# Patient Record
Sex: Female | Born: 2000 | Race: White | Hispanic: No | Marital: Single | Smoking: Never smoker
Health system: Southern US, Community
[De-identification: ages and names within clinical notes are randomized; demographics above are authoritative.]

## PROBLEM LIST (undated history)

## (undated) DIAGNOSIS — N390 Urinary tract infection, site not specified: Secondary | ICD-10-CM

## (undated) HISTORY — DX: Urinary tract infection, site not specified: N39.0

## (undated) HISTORY — PX: NO PAST SURGERIES: SHX2092

---
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
## 2020-04-05 NOTE — Patient Instructions (Signed)
I value your feedback and you entrusting us with your care. If you get a [facility] patient survey, I would appreciate you taking the time to let us know about your experience today. Thank you! ? ? ?

## 2020-04-05 NOTE — Progress Notes (Signed)
Patient, No Pcp Per   Chief Complaint  Patient presents with  . Establish Care    Possible skin tags??? Noticed X6 weeks ago noticed one bump, X1 week ago noticed multiple bumps, pt states it may have went away, no pain, no itching      HPI:      Ms. Linda Gardner is a 20 y.o. No obstetric history on file. whose LMP was No LMP recorded., presents today for NP eval of vaginal lesions. Noticed 1 bump on labia about 6 wks ago that was flesh colored. Noticed a few more a few days ago. No sx this AM. No pain, itching. Does shave. No vag d/c, odor.  Has had urinary frequency and nocturia recently, usually with good flow. No dysuria. Hx of UTIs in past and doesn't feel the same. Drinks lots of water and caffeine.  She is sex active, no new partners. Had [facility], amenorrheic with occas spotting. Last STD testing about a yr ago.    Past Medical History:  Diagnosis Date  . UTI (urinary tract infection)     History reviewed. No pertinent surgical history.  Family History  Problem Relation Age of Onset  . Stroke Maternal Grandmother     Social History   Socioeconomic History  . Marital status: Single    Spouse name: Not on file  . Number of children: Not on file  . Years of education: Not on file  . Highest education level: Not on file  Occupational History  . Not on file  Tobacco Use  . Smoking status: Never Smoker  . Smokeless tobacco: Never Used  Vaping Use  . Vaping Use: Never used  Substance and Sexual Activity  . Alcohol use: Never  . Drug use: Never  . Sexual activity: Yes  Other Topics Concern  . Not on file  Social History Narrative  . Not on file   Social Determinants of Health   Financial Resource Strain: Not on file  Food Insecurity: Not on file  Transportation Needs: Not on file  Physical Activity: Not on file  Stress: Not on file  Social Connections: Not on file  Intimate Partner Violence: Not on file    Outpatient Medications Prior to Visit   Medication Sig Dispense Refill  . levonorgestrel (KYLEENA) 19.5 MG IUD by Intrauterine route once.     No facility-administered medications prior to visit.      ROS:  Review of Systems  Constitutional: Negative for fever, malaise/fatigue and weight loss.  Gastrointestinal: Negative for blood in stool, constipation, diarrhea, nausea and vomiting.  Genitourinary: Positive for frequency and genital sores. Negative for dyspareunia, dysuria, flank pain, hematuria, urgency, vaginal bleeding, vaginal discharge and vaginal pain.  Musculoskeletal: Negative for back pain.  Skin: Negative for itching and rash.   BREAST: No symptoms   OBJECTIVE:   Vitals:  BP 108/74   Pulse 72   Temp 98.6 F (37 C) (Oral)   Ht 5\' 7"  (1.702 m)   Wt 144 lb (65.3 kg)   SpO2 99%   BMI 22.55 kg/m   Physical Exam Vitals reviewed.  Constitutional:      Appearance: She is well-developed.  Pulmonary:     Effort: Pulmonary effort is normal.  Genitourinary:    General: Normal vulva.     Pubic Area: No rash.      Labia:        Right: No rash, tenderness or lesion.        Left: No rash,  tenderness or lesion.      Vagina: Bleeding present. No vaginal discharge, erythema or tenderness.     Cervix: Normal.     Uterus: Normal. Not enlarged and not tender.      Adnexa: Right adnexa normal and left adnexa normal.       Right: No mass or tenderness.         Left: No mass or tenderness.       Comments: NEG EXT VAG EXAM; NO LESIONS; IUD STRINGS IN CX OS Musculoskeletal:        General: Normal range of motion.     Cervical back: Normal range of motion.  Skin:    General: Skin is warm and dry.  Neurological:     General: No focal deficit present.     Mental Status: She is alert and oriented to person, place, and time.  Psychiatric:        Mood and Affect: Mood normal.        Behavior: Behavior normal.        Thought Content: Thought content normal.        Judgment: Judgment normal.      Results: Results for orders placed or performed in visit on 04/06/20 (from the past 24 hour(s))  POCT Urinalysis Dipstick     Status: Normal   Collection Time: 04/06/20 11:13 AM  Result Value Ref Range   Color, UA yellow    Clarity, UA clear    Glucose, UA Negative Negative   Bilirubin, UA neg    Ketones, UA neg    Spec Grav, UA 1.020 1.010 - 1.025   Blood, UA trace    pH, UA 6.0 5.0 - 8.0   Protein, UA Negative Negative   Urobilinogen, UA     Nitrite, UA neg    Leukocytes, UA Negative Negative   Appearance     Odor     PT WITH MENSTRUAL SPOTTING  Assessment/Plan: Normal vaginal exam--no ext lesions. Question blocked glands vs papules. Reassurance. F/u prn  Urinary frequency - Plan: POCT Urinalysis Dipstick; neg UA. Most likely due to caffeine. D/C and f/u prn.  Screening for STD (sexually transmitted disease) - Plan: Cervicovaginal ancillary only  Encounter for routine checking of intrauterine contraceptive device (IUD)--IUD strings in cx os    Return if symptoms worsen or fail to improve.  Orry Sigl B. Mariaha Ellington, PA-C 04/06/2020 11:15 AM

## 2020-04-06 ENCOUNTER — Other Ambulatory Visit (HOSPITAL_COMMUNITY)
Admission: RE | Admit: 2020-04-06 | Discharge: 2020-04-06 | Disposition: A | Discharge status: Home / Self Care | Payer: 59 | Source: Ambulatory Visit | Attending: Obstetrics and Gynecology | Admitting: Obstetrics and Gynecology

## 2020-04-06 ENCOUNTER — Encounter: Payer: Self-pay | Admitting: Obstetrics and Gynecology

## 2020-04-06 ENCOUNTER — Other Ambulatory Visit: Payer: Self-pay

## 2020-04-06 ENCOUNTER — Ambulatory Visit (INDEPENDENT_AMBULATORY_CARE_PROVIDER_SITE_OTHER): Payer: 59 | Admitting: Obstetrics and Gynecology

## 2020-04-06 VITALS — BP 108/74 | HR 72 | Temp 98.6°F | Ht 67.0 in | Wt 144.0 lb

## 2020-04-06 DIAGNOSIS — Z30431 Encounter for routine checking of intrauterine contraceptive device: Secondary | ICD-10-CM

## 2020-04-06 DIAGNOSIS — Z01419 Encounter for gynecological examination (general) (routine) without abnormal findings: Secondary | ICD-10-CM | POA: Diagnosis not present

## 2020-04-06 DIAGNOSIS — R350 Frequency of micturition: Secondary | ICD-10-CM | POA: Diagnosis not present

## 2020-04-06 DIAGNOSIS — Z113 Encounter for screening for infections with a predominantly sexual mode of transmission: Secondary | ICD-10-CM | POA: Insufficient documentation

## 2020-04-06 LAB — POCT URINALYSIS DIPSTICK
Bilirubin, UA: NEGATIVE
Glucose, UA: NEGATIVE
Ketones, UA: NEGATIVE
Leukocytes, UA: NEGATIVE
Nitrite, UA: NEGATIVE
Protein, UA: NEGATIVE
Spec Grav, UA: 1.02 (ref 1.010–1.025)
pH, UA: 6 (ref 5.0–8.0)

## 2020-04-07 LAB — CERVICOVAGINAL ANCILLARY ONLY
Chlamydia: NEGATIVE
Comment: NEGATIVE
Comment: NORMAL
Neisseria Gonorrhea: NEGATIVE

## 2020-05-01 ENCOUNTER — Ambulatory Visit (INDEPENDENT_AMBULATORY_CARE_PROVIDER_SITE_OTHER): Payer: 59 | Admitting: Obstetrics and Gynecology

## 2020-05-01 ENCOUNTER — Encounter: Payer: Self-pay | Admitting: Obstetrics and Gynecology

## 2020-05-01 ENCOUNTER — Other Ambulatory Visit: Payer: Self-pay

## 2020-05-01 VITALS — BP 118/74 | Ht 67.0 in | Wt 141.0 lb

## 2020-05-01 DIAGNOSIS — R350 Frequency of micturition: Secondary | ICD-10-CM | POA: Diagnosis not present

## 2020-05-01 DIAGNOSIS — R300 Dysuria: Secondary | ICD-10-CM

## 2020-05-01 MED ORDER — SULFAMETHOXAZOLE-TRIMETHOPRIM 800-160 MG PO TABS: 1.0000 | ORAL_TABLET | Freq: Two times a day (BID) | ORAL | 0 refills | Status: AC

## 2020-05-01 NOTE — Progress Notes (Signed)
Obstetrics & Gynecology Office Visit    Chief Complaint  Patient presents with  . Urinary Tract Infection    History of Present Illness: Urinary Tract Infection: Patient complains of dysuria and frequency She has had symptoms for 3 days (basically started 1 week ago). Patient also complains of no other issues. Patient denies back pain, fever and vaginal discharge. Patient does have a history of recurrent UTI.  Patient does not have a history of pyelonephritis. She makes her third UTI in the past year. She is sexually active and had STI testing recently.   Past Medical History:  Diagnosis Date  . UTI (urinary tract infection)     Past Surgical History:  Procedure Laterality Date  . NO PAST SURGERIES      Gynecologic History: No LMP recorded.  Obstetric History: G0P0000  Family History  Problem Relation Age of Onset  . Stroke Maternal Grandmother     Social History   Socioeconomic History  . Marital status: Single    Spouse name: Not on file  . Number of children: Not on file  . Years of education: Not on file  . Highest education level: Not on file  Occupational History  . Not on file  Tobacco Use  . Smoking status: Never Smoker  . Smokeless tobacco: Never Used  Vaping Use  . Vaping Use: Never used  Substance and Sexual Activity  . Alcohol use: Never  . Drug use: Never  . Sexual activity: Yes  Other Topics Concern  . Not on file  Social History Narrative  . Not on file   Social Determinants of Health   Financial Resource Strain: Not on file  Food Insecurity: Not on file  Transportation Needs: Not on file  Physical Activity: Not on file  Stress: Not on file  Social Connections: Not on file  Intimate Partner Violence: Not on file    No Known Allergies  Prior to Admission medications   Medication Sig Start Date End Date Taking? Authorizing Provider  levonorgestrel (KYLEENA) 19.5 MG IUD by Intrauterine route once.    [provider]     Review of Systems  Constitutional: Negative.   HENT: Negative.   Eyes: Negative.   Respiratory: Negative.   Cardiovascular: Negative.   Gastrointestinal: Negative.   Genitourinary: Negative.   Musculoskeletal: Negative.   Skin: Negative.   Neurological: Negative.   Psychiatric/Behavioral: Negative.      Physical Exam BP 118/74   Ht 5\' 7"  (1.702 m)   Wt 141 lb (64 kg)   BMI 22.08 kg/m  No LMP recorded. Physical Exam Constitutional:      General: She is not in acute distress.    Appearance: Normal appearance.  HENT:     Head: Normocephalic and atraumatic.  Eyes:     General: No scleral icterus.    Conjunctiva/sclera: Conjunctivae normal.  Neurological:     General: No focal deficit present.     Mental Status: She is alert and oriented to person, place, and time.     Cranial Nerves: No cranial nerve deficit.  Psychiatric:        Mood and Affect: Mood normal.        Behavior: Behavior normal.        Judgment: Judgment normal.    UA: equivocal due to taking AZO  Female chaperone present for pelvic and breast  portions of the physical exam  Assessment: 20 y.o. G0P0000 female here for  1. Dysuria   2. Urinary frequency  Plan: Problem List Items Addressed This Visit   None   Visit Diagnoses    Dysuria    -  Primary   Relevant Medications   sulfamethoxazole-trimethoprim (BACTRIM DS) 800-160 MG tablet   Other Relevant Orders   Urine Culture   Urinary frequency       Relevant Medications   sulfamethoxazole-trimethoprim (BACTRIM DS) 800-160 MG tablet   Other Relevant Orders   Urine Culture     Will send urine for culture and sensitivities.  With 3 UTI this year, may be a candidate for rx as needed.   A total of 21 minutes were spent face-to-face with the patient as well as preparation, review, communication, and documentation during this encounter.    Thomasene Mohair, MD 05/01/2020 5:09 PM

## 2020-05-04 LAB — URINE CULTURE

## 2021-07-25 ENCOUNTER — Encounter: Payer: Self-pay | Admitting: Emergency Medicine

## 2021-07-25 ENCOUNTER — Other Ambulatory Visit: Payer: Self-pay

## 2021-07-25 ENCOUNTER — Emergency Department: Payer: 59

## 2021-07-25 ENCOUNTER — Emergency Department
Admission: EM | Admit: 2021-07-25 | Discharge: 2021-07-25 | Disposition: A | Discharge status: Home / Self Care | Payer: 59 | Attending: Emergency Medicine | Admitting: Emergency Medicine

## 2021-07-25 DIAGNOSIS — M542 Cervicalgia: Secondary | ICD-10-CM | POA: Insufficient documentation

## 2021-07-25 DIAGNOSIS — R519 Headache, unspecified: Secondary | ICD-10-CM | POA: Diagnosis not present

## 2021-07-25 DIAGNOSIS — Y92410 Unspecified street and highway as the place of occurrence of the external cause: Secondary | ICD-10-CM | POA: Diagnosis not present

## 2021-07-25 DIAGNOSIS — S60511A Abrasion of right hand, initial encounter: Secondary | ICD-10-CM | POA: Insufficient documentation

## 2021-07-25 DIAGNOSIS — S6991XA Unspecified injury of right wrist, hand and finger(s), initial encounter: Secondary | ICD-10-CM | POA: Diagnosis present

## 2021-07-25 MED ORDER — METHOCARBAMOL 500 MG PO TABS
500.0000 mg | ORAL_TABLET | Freq: Three times a day (TID) | ORAL | 0 refills | Status: AC | PRN
Start: 2021-07-25 — End: 2021-07-30

## 2021-07-25 MED ORDER — MELOXICAM 15 MG PO TABS: 15.0000 mg | ORAL_TABLET | Freq: Every day | ORAL | 2 refills | Status: DC
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
## 2021-07-25 NOTE — Discharge Instructions (Signed)
Take meloxicam and Robaxin as directed. 

## 2022-04-17 ENCOUNTER — Encounter: Payer: Self-pay | Admitting: Oncology

## 2022-04-17 ENCOUNTER — Ambulatory Visit (INDEPENDENT_AMBULATORY_CARE_PROVIDER_SITE_OTHER): Payer: Managed Care, Other (non HMO) | Admitting: Oncology

## 2022-04-17 VITALS — BP 112/70 | HR 56 | Temp 98.3°F | Resp 18 | Ht 67.0 in | Wt 129.0 lb

## 2022-04-17 DIAGNOSIS — K130 Diseases of lips: Secondary | ICD-10-CM

## 2022-04-17 NOTE — Progress Notes (Signed)
[address], [place] [address] Phone: [phone] Fax: [phone]   Office Visit Note  Patient Name: Linda Gardner  Date of JJOAC:[id number]  Med Rec number [id number]  Date of Service: 04/17/2022  Patient has no known allergies.  Chief Complaint  Patient presents with   Rash   Patient is an 22 y.o. student here for complaints of lip lesion. Sx started two weeks ago. Felt like her whole right side of her face was puffy one morning. Denies pain. While showering the lesion on her lip "popped" open and white pus/discharge came out.  Has history of acne but not cystic.  Denies injury.  Denies any new products or medications.  States she only had pain the day and it "popped".  Otherwise he is feels okay.  Has not tried anything over-the-counter.  Would like a referral to dermatology to have it looked at.  No fevers.   Current Medication:  Outpatient Encounter Medications as of 04/17/2022  Medication Sig   levonorgestrel (KYLEENA) 19.5 MG IUD by Intrauterine route once.   [DISCONTINUED] meloxicam (MOBIC) 15 MG tablet Take 1 tablet (15 mg total) by mouth daily.   No facility-administered encounter medications on file as of 04/17/2022.     Medical History: Past Medical History:  Diagnosis Date   UTI (urinary tract infection)    Vital Signs: BP 112/70   Pulse (!) 56   Temp 98.3 F (36.8 C) (Tympanic)   Resp 18   Ht 5\' 7"  (1.702 m)   Wt 129 lb (58.5 kg)   SpO2 99%   BMI 20.20 kg/m   ROS: As per HPI.  All other pertinent ROS negative.     Review of Systems  Skin:        Upper lip skin lesion    Physical Exam Constitutional:      Appearance: Normal appearance.  Skin:    Findings: Lesion and rash present. Rash is nodular.     Comments: Right upper lip nodular lesion about the size of a BB. No fluctuant head. No pain. No erythema.   Neurological:     Mental Status: She is alert.    No results found for this or any previous visit (from  the past 24 hour(s)).  Assessment/Plan: 1. Lip lesion -Referral to dermatology per student and family. -Discussed trying warm compresses to see if this promotes the nodule to come to the surface. -If symptoms become painful or worsen, would recommend an antibiotic such as doxycycline until she is evaluated by dermatology.  Information sent via MyChart for her to review.  - Ambulatory referral to Dermatology   Disposition-return to clinic for worsening symptoms.  Please let me know if he would like to try doxycycline should your symptoms worsen.  General Counseling: Linda Gardner understanding of the findings of todays visit and agrees with plan of treatment. I have discussed any further diagnostic evaluation that may be needed or ordered today. We also reviewed her medications today. she has been encouraged to call the office with any questions or concerns that should arise related to todays visit.   No orders of the defined types were placed in this encounter.   No orders of the defined types were placed in this encounter.   I spent 20 minutes dedicated to the care of this patient (face-to-face and non-face-to-face) on the date of the encounter to include what is described in the assessment and plan.   Linda Casa, NP 04/17/2022 9:40 AM

## 2022-04-22 ENCOUNTER — Telehealth: Payer: Self-pay | Admitting: Oncology

## 2022-04-22 NOTE — Telephone Encounter (Signed)
Mother called and stated that Linda Gardner would not be able to see her until march, so she called around and got Linda Gardner scheduled for Thursday 1/25 at [facility]. I sent referral notes to them Monday afternoon, got the confirmation fax and called their office to make sure they received. I called patient and mother back as well to let them know this. This is for your FYI. Not sure if you need to change anything in EPIC for the referral.

## 2022-04-23 NOTE — Telephone Encounter (Signed)
Thanks so much! [place] faxed over all her notes and demographics last week to [facility] so not sure what happened. Thanks again.   Faythe Casa, NP 04/23/2022 10:13 AM

## 2022-06-06 ENCOUNTER — Ambulatory Visit (INDEPENDENT_AMBULATORY_CARE_PROVIDER_SITE_OTHER): Payer: Managed Care, Other (non HMO) | Admitting: Medical

## 2022-06-06 ENCOUNTER — Other Ambulatory Visit: Payer: Self-pay

## 2022-06-06 ENCOUNTER — Encounter: Payer: Self-pay | Admitting: Medical

## 2022-06-06 VITALS — BP 110/80 | HR 65 | Temp 97.6°F | Ht 67.01 in | Wt 128.0 lb

## 2022-06-06 DIAGNOSIS — R0981 Nasal congestion: Secondary | ICD-10-CM

## 2022-06-06 DIAGNOSIS — R5383 Other fatigue: Secondary | ICD-10-CM | POA: Diagnosis not present

## 2022-06-06 DIAGNOSIS — J069 Acute upper respiratory infection, unspecified: Secondary | ICD-10-CM

## 2022-06-06 LAB — POC COVID19 BINAXNOW: SARS Coronavirus 2 Ag: NEGATIVE

## 2022-06-06 NOTE — Progress Notes (Signed)
[address], [place] [address] Phone: [phone] Fax: [phone]   Office Visit Note  Patient Name: Linda Gardner  Date of [id number]:[id number]  Med Rec number [id number]  Date of Service: 06/06/2022  Allergies: Patient has no known allergies.  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  Has had cold symptoms beginning 3 days ago. Had sore throat and fatigue initially. Had 100.9 fever 2 nights ago. Felt cold/chilled next day. Also developed head congestion and clear nasal d/c. Has had HA. Some body aches. Minimal cough. No nausea, vomiting or diarrhea. Not expectorating any mucus. No fever in last 36 hrs. Sore throat has improved.  Had flu shot. Denies known exposures/sick contacts.  Using Tylenol Cold and Flu and Advil. No meds today.   No hx of sinus infections. Sometimes has spring allergies.    Current Medication:  Outpatient Encounter Medications as of 06/06/2022  Medication Sig   levonorgestrel (KYLEENA) 19.5 MG IUD by Intrauterine route once.   No facility-administered encounter medications on file as of 06/06/2022.      Medical History: Past Medical History:  Diagnosis Date   UTI (urinary tract infection)      Vital Signs: BP 110/80   Pulse 65   Temp 97.6 F (36.4 C) (Tympanic)   Ht 5' 7.01" (1.702 m)   Wt 128 lb (58.1 kg)   SpO2 97%   BMI 20.04 kg/m    Review of Systems See HPI  Physical Exam Vitals reviewed.  Constitutional:      General: She is not in acute distress.    Appearance: She is not ill-appearing.     Comments: Tired appearing  HENT:     Head: Normocephalic.     Right Ear: Tympanic membrane, ear canal and external ear normal.     Left Ear: Tympanic membrane, ear canal and external ear normal.     Nose: Mucosal edema (mild) and congestion (mild) present. No rhinorrhea.     Right Sinus: No maxillary sinus tenderness or frontal  sinus tenderness.     Left Sinus: No maxillary sinus tenderness or frontal sinus tenderness.     Mouth/Throat:     Mouth: Mucous membranes are moist. No oral lesions.     Pharynx: No pharyngeal swelling or posterior oropharyngeal erythema.     Tonsils: No tonsillar exudate. 0 on the right. 0 on the left.  Cardiovascular:     Rate and Rhythm: Normal rate and regular rhythm.     Heart sounds: No murmur heard.    No friction rub. No gallop.  Pulmonary:     Effort: Pulmonary effort is normal.     Breath sounds: Normal breath sounds. No wheezing, rhonchi or rales.  Musculoskeletal:     Cervical back: Neck supple. No rigidity.  Lymphadenopathy:     Cervical: Cervical adenopathy (1+ anterior nodes, mildly tender) present.  Neurological:     Mental Status: She is alert.    Recent Results (from the past 2160 hour(s))  POC COVID-19     Status: Normal   Collection Time: 06/06/22 10:38 AM  Result Value Ref Range   SARS Coronavirus 2 Ag Negative Negative      Assessment/Plan: 1. Acute upper respiratory infection 2. Nasal congestion 3. Fatigue, unspecified type Suspect viral infection, symptoms improving somewhat in last 24 hours. COVID-19 antigen test negative. Discussed that flu test would not significantly change management given  duration of symptoms, patient declined flu test. Encouraged to continue supportive and symptomatic treatment.  - POC COVID-19   Patient Instructions:  -Rest and stay well hydrated (by drinking water and other liquids). Avoid/limit caffeine. -Take over-the-counter medicines (i.e. Dayquil, Nyquil, Ibuprofen) to help relieve your symptoms. -For your sore throat/cough, use cough drops/throat lozenges, gargle warm salt water and/or drink warm liquids (like tea with honey). -Send MyChart message to provider or schedule return visit as needed for new/worsening symptoms or if symptoms do not improve as discussed with recommended treatment over next 5-7  days.  General Counseling: omara alcon understanding of the findings of todays visit and agrees with plan of treatment. she has been encouraged to call the office with any questions or concerns that should arise related to todays visit.   No orders of the defined types were placed in this encounter.   No orders of the defined types were placed in this encounter.   Time spent:20 [place] PA-C [place] 06/06/2022 10:04 AM

## 2022-06-09 NOTE — Patient Instructions (Signed)
-  Rest and stay well hydrated (by drinking water and other liquids). Avoid/limit caffeine. -Take over-the-counter medicines (i.e. Dayquil, Nyquil, Ibuprofen) to help relieve your symptoms. -For your sore throat/cough, use cough drops/throat lozenges, gargle warm salt water and/or drink warm liquids (like tea with honey). -Send MyChart message to provider or schedule return visit as needed for new/worsening symptoms or if symptoms do not improve as discussed with recommended treatment over next 5-7 days.  

## 2022-06-17 ENCOUNTER — Ambulatory Visit: Payer: 59 | Admitting: Dermatology

## 2022-08-05 ENCOUNTER — Other Ambulatory Visit: Payer: Self-pay

## 2022-08-05 ENCOUNTER — Encounter: Payer: Self-pay | Admitting: Medical

## 2022-08-05 ENCOUNTER — Ambulatory Visit (INDEPENDENT_AMBULATORY_CARE_PROVIDER_SITE_OTHER): Payer: Managed Care, Other (non HMO) | Admitting: Medical

## 2022-08-05 VITALS — BP 100/60 | HR 69 | Temp 98.1°F | Wt 122.0 lb

## 2022-08-05 DIAGNOSIS — Z113 Encounter for screening for infections with a predominantly sexual mode of transmission: Secondary | ICD-10-CM | POA: Diagnosis not present

## 2022-08-05 NOTE — Patient Instructions (Signed)
.-  You will receive a MyChart message notifying you of your lab results when they are available.  -Use condoms consistently every time you have sex. 

## 2022-08-05 NOTE — Progress Notes (Signed)
[facility] [address] [place], [place] [address] Phone: [phone] Fax: [phone]   Office Visit Note  Patient Name: Linda Gardner  Date of [id number]  Med Rec number [id number]  Date of Service: 08/05/2022  Allergies: Patient has no known allergies.  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  Denies any concerning symptoms. Thinks last screening was in Dec 2023. Has had 1 new female sexual partner since then, uses condoms most of time. Partner recently had negative testing.    Current Medication:  Outpatient Encounter Medications as of 08/05/2022  Medication Sig   levonorgestrel (KYLEENA) 19.5 MG IUD by Intrauterine route once.   No facility-administered encounter medications on file as of 08/05/2022.      Medical History: Past Medical History:  Diagnosis Date   UTI (urinary tract infection)      Vital Signs: BP 100/60   Pulse 69   Temp 98.1 F (36.7 C) (Tympanic)   Wt 122 lb (55.3 kg)   SpO2 99%   BMI 19.10 kg/m    Review of Systems  Genitourinary:  Negative for dyspareunia, dysuria, genital sores, pelvic pain, vaginal discharge and vaginal pain.    Physical Exam Vitals reviewed.  Constitutional:      General: She is not in acute distress.    Appearance: She is not ill-appearing.  Neurological:     Mental Status: She is alert.       Assessment/Plan: 1. Screening examination for STD (sexually transmitted disease) Discussed available testing options.  Patient elected for Gonorrhea, Chlamydia, Trichomonas testing today.  Patient declined HIV and Syphilis testing. Patient will be contacted via MyChart when labs results are available.  Encouraged consistent condom use, as well as regular testing (every 3-6 months) when changing sex partners, or when having multiple partners.    - Chlamydia/Gonococcus/Trichomonas, NAA     General Counseling: Larose  verbalizes understanding of the findings of todays visit and agrees with plan of treatment. she has been encouraged to call the office with any questions or concerns that should arise related to todays visit.   Orders Placed This Encounter  Procedures   Chlamydia/Gonococcus/Trichomonas, NAA     Time spent:15 Minutes    Jonathon Resides PA-C [facility] 08/05/2022 1:38 PM

## 2022-08-07 LAB — CHLAMYDIA/GONOCOCCUS/TRICHOMONAS, NAA
Chlamydia by NAA: NEGATIVE
Gonococcus by NAA: NEGATIVE
Trich vag by NAA: NEGATIVE
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
